# Patient Record
Sex: Male | Born: 1945 | Race: White | Hispanic: No | Marital: Married | Smoking: Current every day smoker
Health system: Southern US, Community
[De-identification: ages and names within clinical notes are randomized; demographics above are authoritative.]

## PROBLEM LIST (undated history)

## (undated) DIAGNOSIS — E119 Type 2 diabetes mellitus without complications: Secondary | ICD-10-CM

## (undated) DIAGNOSIS — J449 Chronic obstructive pulmonary disease, unspecified: Secondary | ICD-10-CM

## (undated) DIAGNOSIS — M4800 Spinal stenosis, site unspecified: Secondary | ICD-10-CM

## (undated) DIAGNOSIS — I10 Essential (primary) hypertension: Secondary | ICD-10-CM

## (undated) DIAGNOSIS — M5136 Other intervertebral disc degeneration, lumbar region: Secondary | ICD-10-CM

## (undated) DIAGNOSIS — M5416 Radiculopathy, lumbar region: Secondary | ICD-10-CM

## (undated) DIAGNOSIS — E7800 Pure hypercholesterolemia, unspecified: Secondary | ICD-10-CM

## (undated) DIAGNOSIS — G5600 Carpal tunnel syndrome, unspecified upper limb: Secondary | ICD-10-CM

## (undated) DIAGNOSIS — E669 Obesity, unspecified: Secondary | ICD-10-CM

## (undated) DIAGNOSIS — M48062 Spinal stenosis, lumbar region with neurogenic claudication: Secondary | ICD-10-CM

## (undated) DIAGNOSIS — G629 Polyneuropathy, unspecified: Secondary | ICD-10-CM

## (undated) DIAGNOSIS — M5412 Radiculopathy, cervical region: Secondary | ICD-10-CM

## (undated) DIAGNOSIS — I6523 Occlusion and stenosis of bilateral carotid arteries: Secondary | ICD-10-CM

## (undated) DIAGNOSIS — M5030 Other cervical disc degeneration, unspecified cervical region: Secondary | ICD-10-CM

## (undated) DIAGNOSIS — I739 Peripheral vascular disease, unspecified: Secondary | ICD-10-CM

## (undated) DIAGNOSIS — F17200 Nicotine dependence, unspecified, uncomplicated: Secondary | ICD-10-CM

## (undated) DIAGNOSIS — D638 Anemia in other chronic diseases classified elsewhere: Secondary | ICD-10-CM

## (undated) HISTORY — PX: OTHER SURGICAL HISTORY: SHX169

---

## 2004-04-23 ENCOUNTER — Ambulatory Visit: Payer: Self-pay | Admitting: Unknown Physician Specialty

## 2004-05-10 ENCOUNTER — Ambulatory Visit: Payer: Self-pay | Admitting: Unknown Physician Specialty

## 2004-05-11 ENCOUNTER — Ambulatory Visit: Payer: Self-pay | Admitting: Unknown Physician Specialty

## 2005-07-20 ENCOUNTER — Ambulatory Visit: Payer: Self-pay | Admitting: General Surgery

## 2005-08-02 ENCOUNTER — Ambulatory Visit: Payer: Self-pay | Admitting: General Surgery

## 2007-01-31 ENCOUNTER — Ambulatory Visit: Payer: Self-pay | Admitting: Unknown Physician Specialty

## 2007-06-07 ENCOUNTER — Ambulatory Visit: Payer: Self-pay | Admitting: Neurosurgery

## 2008-09-05 ENCOUNTER — Encounter: Admission: RE | Admit: 2008-09-05 | Discharge: 2008-09-05 | Payer: Self-pay | Admitting: Neurosurgery

## 2009-03-20 ENCOUNTER — Encounter: Payer: Self-pay | Admitting: Urology

## 2009-03-24 ENCOUNTER — Encounter: Payer: Self-pay | Admitting: Urology

## 2009-04-24 ENCOUNTER — Encounter: Payer: Self-pay | Admitting: Urology

## 2009-11-19 ENCOUNTER — Ambulatory Visit: Payer: Self-pay | Admitting: Vascular Surgery

## 2010-12-02 ENCOUNTER — Ambulatory Visit: Payer: Self-pay

## 2013-09-10 ENCOUNTER — Ambulatory Visit: Payer: Self-pay | Admitting: Physical Medicine and Rehabilitation

## 2014-07-14 ENCOUNTER — Ambulatory Visit
Admission: RE | Admit: 2014-07-14 | Discharge: 2014-07-14 | Disposition: A | Discharge status: Home / Self Care | Payer: Medicare Other | Source: Ambulatory Visit | Attending: Physical Medicine and Rehabilitation | Admitting: Physical Medicine and Rehabilitation

## 2014-07-14 ENCOUNTER — Other Ambulatory Visit: Payer: Self-pay | Admitting: Physical Medicine and Rehabilitation

## 2014-07-14 DIAGNOSIS — M7989 Other specified soft tissue disorders: Secondary | ICD-10-CM

## 2014-07-14 DIAGNOSIS — M79604 Pain in right leg: Secondary | ICD-10-CM | POA: Insufficient documentation

## 2014-07-14 DIAGNOSIS — M79661 Pain in right lower leg: Secondary | ICD-10-CM

## 2014-07-24 ENCOUNTER — Other Ambulatory Visit: Payer: Self-pay | Admitting: Physical Medicine and Rehabilitation

## 2014-07-24 DIAGNOSIS — M25571 Pain in right ankle and joints of right foot: Secondary | ICD-10-CM

## 2014-07-25 ENCOUNTER — Ambulatory Visit
Admission: RE | Admit: 2014-07-25 | Discharge: 2014-07-25 | Disposition: A | Discharge status: Home / Self Care | Payer: Medicare Other | Source: Ambulatory Visit | Attending: Physical Medicine and Rehabilitation | Admitting: Physical Medicine and Rehabilitation

## 2014-07-25 DIAGNOSIS — M25571 Pain in right ankle and joints of right foot: Secondary | ICD-10-CM

## 2019-05-18 ENCOUNTER — Other Ambulatory Visit: Payer: Self-pay

## 2019-05-18 ENCOUNTER — Ambulatory Visit: Payer: Medicare Other | Attending: Internal Medicine

## 2019-05-18 DIAGNOSIS — Z23 Encounter for immunization: Secondary | ICD-10-CM

## 2019-05-18 NOTE — Progress Notes (Signed)
   Covid-19 Vaccination Clinic  Name:  Paul Figueroa    MRN: [id number] DOB: 1945/04/27  05/18/2019  [name] was observed post Covid-19 immunization for 15 minutes without incident. He was provided with Vaccine Information Sheet and instruction to access the V-Safe system.   [name] was instructed to call 911 with any severe reactions post vaccine: Marland Kitchen Difficulty breathing  . Swelling of face and throat  . A fast heartbeat  . A bad rash all over body  . Dizziness and weakness   Immunizations Administered    Name Date Dose VIS Date Route   Pfizer COVID-19 Vaccine 05/18/2019 11:20 AM 0.3 mL 03/20/2018 Intramuscular   Manufacturer: [employer], [employer]   Lot: [id number]   NDC: [address]-5

## 2019-06-11 ENCOUNTER — Ambulatory Visit: Payer: Medicare Other | Attending: Internal Medicine

## 2019-06-11 DIAGNOSIS — Z23 Encounter for immunization: Secondary | ICD-10-CM

## 2019-06-11 NOTE — Progress Notes (Signed)
   Covid-19 Vaccination Clinic  Name:  Paul Figueroa    MRN: [id number] DOB: 1945-12-14  06/11/2019  [name] was observed post Covid-19 immunization for 15 minutes without incident. He was provided with Vaccine Information Sheet and instruction to access the V-Safe system.   [name] was instructed to call 911 with any severe reactions post vaccine: Marland Kitchen Difficulty breathing  . Swelling of face and throat  . A fast heartbeat  . A bad rash all over body  . Dizziness and weakness   Immunizations Administered    Name Date Dose VIS Date Route   Pfizer COVID-19 Vaccine 06/11/2019  2:05 PM 0.3 mL 03/20/2018 Intramuscular   Manufacturer: [employer], [employer]   Lot: [id number]   NDC: [address]-6

## 2019-06-18 ENCOUNTER — Emergency Department: Payer: Medicare Other

## 2019-06-18 ENCOUNTER — Encounter: Payer: Self-pay | Admitting: *Deleted

## 2019-06-18 ENCOUNTER — Other Ambulatory Visit: Payer: Self-pay

## 2019-06-18 DIAGNOSIS — F17200 Nicotine dependence, unspecified, uncomplicated: Secondary | ICD-10-CM | POA: Insufficient documentation

## 2019-06-18 DIAGNOSIS — E869 Volume depletion, unspecified: Secondary | ICD-10-CM | POA: Insufficient documentation

## 2019-06-18 DIAGNOSIS — R799 Abnormal finding of blood chemistry, unspecified: Secondary | ICD-10-CM | POA: Insufficient documentation

## 2019-06-18 DIAGNOSIS — E119 Type 2 diabetes mellitus without complications: Secondary | ICD-10-CM | POA: Insufficient documentation

## 2019-06-18 DIAGNOSIS — Y92410 Unspecified street and highway as the place of occurrence of the external cause: Secondary | ICD-10-CM | POA: Insufficient documentation

## 2019-06-18 DIAGNOSIS — S8001XA Contusion of right knee, initial encounter: Secondary | ICD-10-CM | POA: Diagnosis not present

## 2019-06-18 DIAGNOSIS — Y9389 Activity, other specified: Secondary | ICD-10-CM | POA: Diagnosis not present

## 2019-06-18 DIAGNOSIS — S20219A Contusion of unspecified front wall of thorax, initial encounter: Secondary | ICD-10-CM | POA: Diagnosis not present

## 2019-06-18 DIAGNOSIS — Y998 Other external cause status: Secondary | ICD-10-CM | POA: Diagnosis not present

## 2019-06-18 DIAGNOSIS — Z7984 Long term (current) use of oral hypoglycemic drugs: Secondary | ICD-10-CM | POA: Diagnosis not present

## 2019-06-18 DIAGNOSIS — S299XXA Unspecified injury of thorax, initial encounter: Secondary | ICD-10-CM | POA: Diagnosis present

## 2019-06-18 LAB — CBC
HCT: 38.2 % — ABNORMAL LOW (ref 39.0–52.0)
Hemoglobin: 12.6 g/dL — ABNORMAL LOW (ref 13.0–17.0)
MCH: 28.3 pg (ref 26.0–34.0)
MCHC: 33 g/dL (ref 30.0–36.0)
MCV: 85.7 fL (ref 80.0–100.0)
Platelets: 349 10*3/uL (ref 150–400)
RBC: 4.46 MIL/uL (ref 4.22–5.81)
RDW: 13.5 % (ref 11.5–15.5)
WBC: 10.1 10*3/uL (ref 4.0–10.5)
nRBC: 0 % (ref 0.0–0.2)

## 2019-06-18 LAB — BASIC METABOLIC PANEL
Anion gap: 8 (ref 5–15)
BUN: 42 mg/dL — ABNORMAL HIGH (ref 8–23)
CO2: 22 mmol/L (ref 22–32)
Calcium: 8.3 mg/dL — ABNORMAL LOW (ref 8.9–10.3)
Chloride: 109 mmol/L (ref 98–111)
Creatinine, Ser: 1.57 mg/dL — ABNORMAL HIGH (ref 0.61–1.24)
GFR calc Af Amer: 49 mL/min — ABNORMAL LOW (ref >60)
GFR calc non Af Amer: 42 mL/min — ABNORMAL LOW (ref >60)
Glucose, Bld: 141 mg/dL — ABNORMAL HIGH (ref 70–99)
Potassium: 4.3 mmol/L (ref 3.5–5.1)
Sodium: 139 mmol/L (ref 135–145)

## 2019-06-18 LAB — TROPONIN I (HIGH SENSITIVITY): Troponin I (High Sensitivity): 12 ng/L (ref <18)

## 2019-06-18 MED ORDER — SODIUM CHLORIDE 0.9% FLUSH: 3.0000 mL | Freq: Once | INTRAVENOUS | Status: DC
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
## 2019-06-19 ENCOUNTER — Emergency Department: Payer: Medicare Other

## 2019-06-19 ENCOUNTER — Emergency Department
Admission: EM | Admit: 2019-06-19 | Discharge: 2019-06-19 | Disposition: A | Discharge status: Home / Self Care | Payer: Medicare Other | Attending: Emergency Medicine | Admitting: Emergency Medicine

## 2019-06-19 ENCOUNTER — Encounter: Payer: Self-pay | Admitting: Emergency Medicine

## 2019-06-19 DIAGNOSIS — S20219A Contusion of unspecified front wall of thorax, initial encounter: Secondary | ICD-10-CM

## 2019-06-19 DIAGNOSIS — R7989 Other specified abnormal findings of blood chemistry: Secondary | ICD-10-CM

## 2019-06-19 DIAGNOSIS — E869 Volume depletion, unspecified: Secondary | ICD-10-CM

## 2019-06-19 HISTORY — DX: Nicotine dependence, unspecified, uncomplicated: F17.200

## 2019-06-19 HISTORY — DX: Type 2 diabetes mellitus without complications: E11.9

## 2019-06-19 HISTORY — DX: Radiculopathy, cervical region: M54.12

## 2019-06-19 HISTORY — DX: Chronic obstructive pulmonary disease, unspecified: J44.9

## 2019-06-19 HISTORY — DX: Radiculopathy, lumbar region: M54.16

## 2019-06-19 HISTORY — DX: Spinal stenosis, lumbar region with neurogenic claudication: M48.062

## 2019-06-19 HISTORY — DX: Anemia in other chronic diseases classified elsewhere: D63.8

## 2019-06-19 HISTORY — DX: Essential (primary) hypertension: I10

## 2019-06-19 HISTORY — DX: Pure hypercholesterolemia, unspecified: E78.00

## 2019-06-19 HISTORY — DX: Spinal stenosis, site unspecified: M48.00

## 2019-06-19 HISTORY — DX: Carpal tunnel syndrome, unspecified upper limb: G56.00

## 2019-06-19 HISTORY — DX: Other cervical disc degeneration, unspecified cervical region: M50.30

## 2019-06-19 HISTORY — DX: Occlusion and stenosis of bilateral carotid arteries: I65.23

## 2019-06-19 HISTORY — DX: Peripheral vascular disease, unspecified: I73.9

## 2019-06-19 HISTORY — DX: Polyneuropathy, unspecified: G62.9

## 2019-06-19 HISTORY — DX: Other intervertebral disc degeneration, lumbar region: M51.36

## 2019-06-19 HISTORY — DX: Obesity, unspecified: E66.9

## 2019-06-19 LAB — TROPONIN I (HIGH SENSITIVITY): Troponin I (High Sensitivity): 12 ng/L (ref <18)
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
## 2019-06-19 NOTE — ED Notes (Signed)
Pt at CT at this time.
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
## 2019-11-26 ENCOUNTER — Ambulatory Visit
Admission: RE | Admit: 2019-11-26 | Discharge: 2019-11-26 | Disposition: A | Discharge status: Home / Self Care | Payer: Medicare Other | Source: Ambulatory Visit | Attending: Family Medicine | Admitting: Family Medicine

## 2019-11-26 ENCOUNTER — Other Ambulatory Visit: Payer: Self-pay | Admitting: Family Medicine

## 2019-11-26 ENCOUNTER — Other Ambulatory Visit: Payer: Self-pay

## 2019-11-26 ENCOUNTER — Other Ambulatory Visit (HOSPITAL_COMMUNITY): Payer: Self-pay | Admitting: Family Medicine

## 2019-11-26 DIAGNOSIS — L539 Erythematous condition, unspecified: Secondary | ICD-10-CM

## 2019-11-26 DIAGNOSIS — M79661 Pain in right lower leg: Secondary | ICD-10-CM

## 2019-11-26 DIAGNOSIS — M7989 Other specified soft tissue disorders: Secondary | ICD-10-CM

## 2019-12-17 ENCOUNTER — Other Ambulatory Visit: Payer: Self-pay | Admitting: Physician Assistant

## 2019-12-17 DIAGNOSIS — M79661 Pain in right lower leg: Secondary | ICD-10-CM

## 2019-12-17 DIAGNOSIS — I739 Peripheral vascular disease, unspecified: Secondary | ICD-10-CM

## 2019-12-23 ENCOUNTER — Ambulatory Visit: Payer: Medicare Other | Attending: Physician Assistant

## 2019-12-24 ENCOUNTER — Other Ambulatory Visit: Payer: Self-pay

## 2019-12-24 ENCOUNTER — Ambulatory Visit
Admission: RE | Admit: 2019-12-24 | Discharge: 2019-12-24 | Disposition: A | Discharge status: Home / Self Care | Payer: Medicare Other | Source: Ambulatory Visit | Attending: Physician Assistant | Admitting: Physician Assistant

## 2019-12-24 DIAGNOSIS — I739 Peripheral vascular disease, unspecified: Secondary | ICD-10-CM | POA: Diagnosis not present

## 2020-09-24 DEATH — deceased
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
## 2021-06-14 IMAGING — US US EXTREM LOW VENOUS*R*
1 series · 13 of 24 positions shown · non-contrast
Comparison: None.

CLINICAL DATA: Right leg pain



[Series 1: us extrem low venous*right* · 0.08mm/px · 13 of 35 slices shown]
[im 1/35]
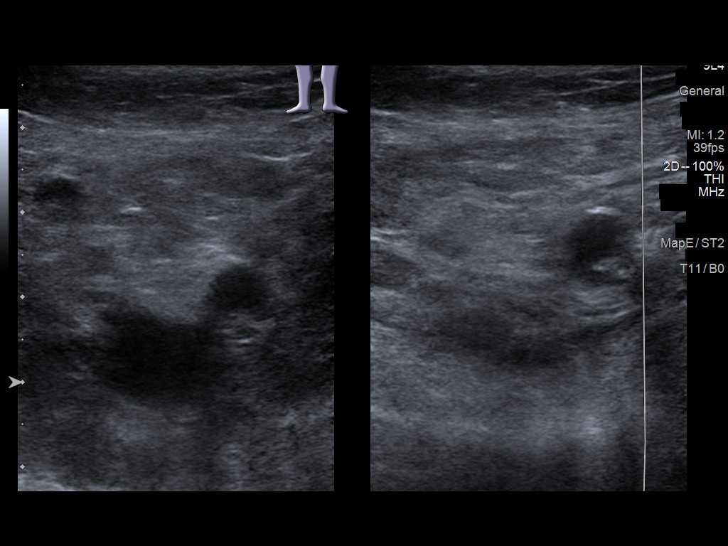
[im 3/35]
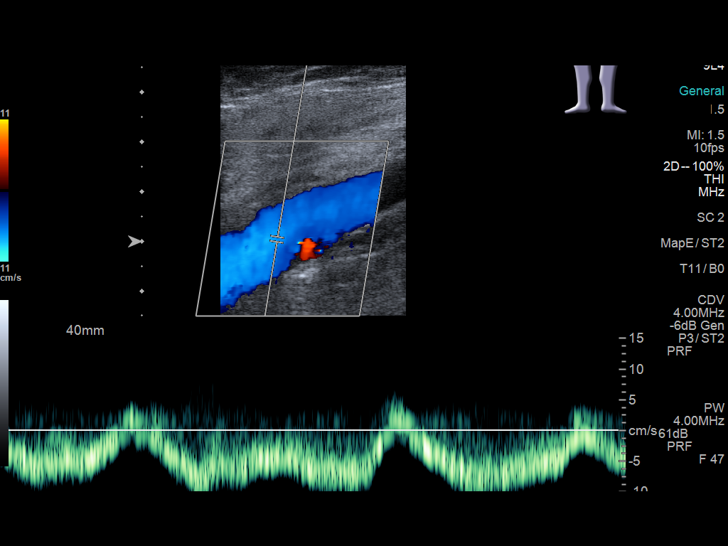
[im 6/35]
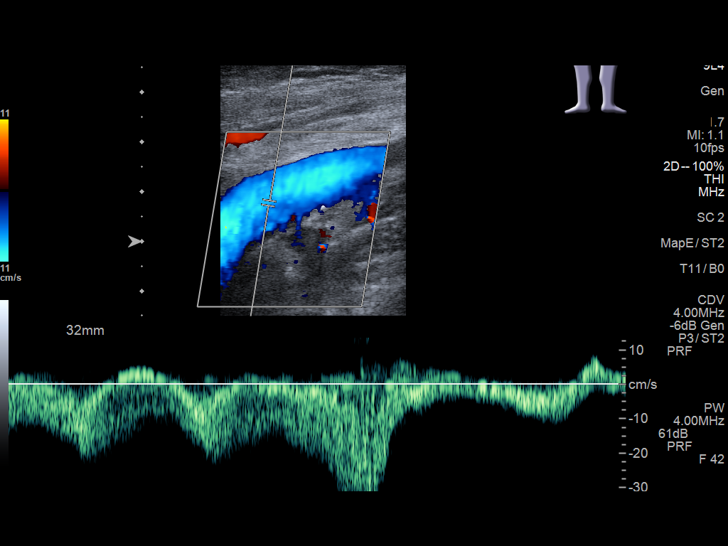
[im 9/35]
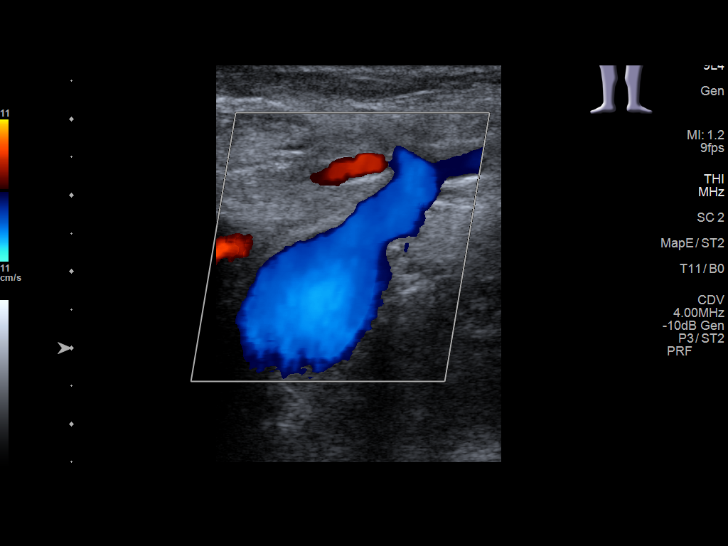
[im 12/35]
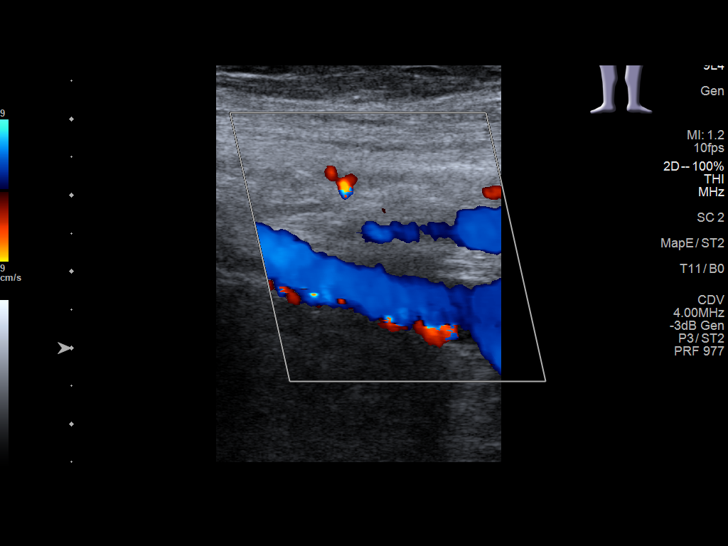
[im 15/35]
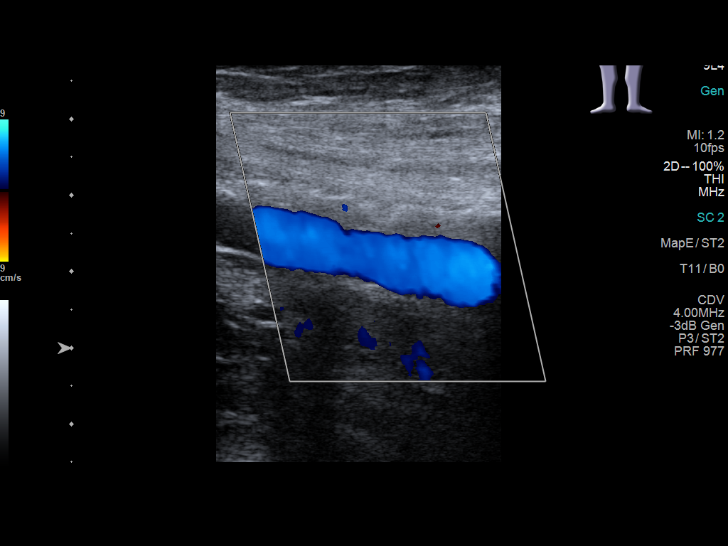
[im 18/35]
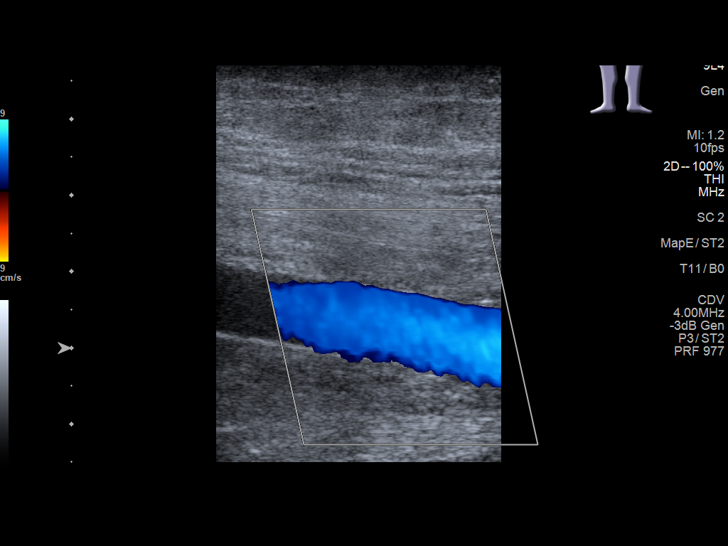
[im 20/35]
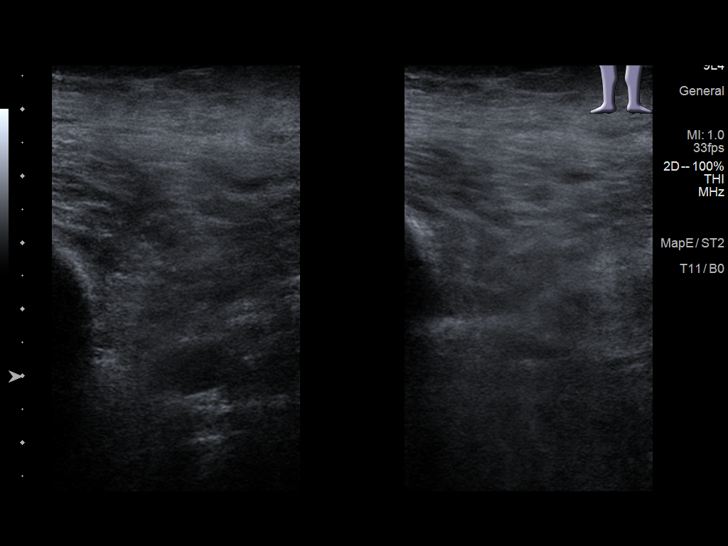
[im 23/35]
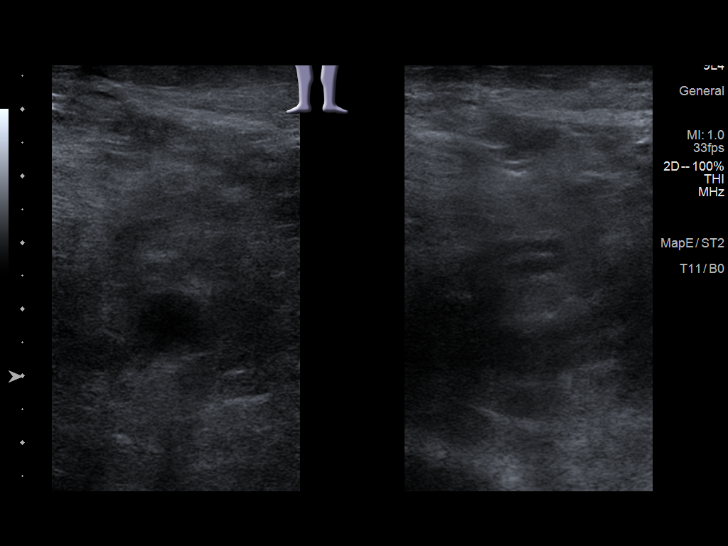
[im 26/35]
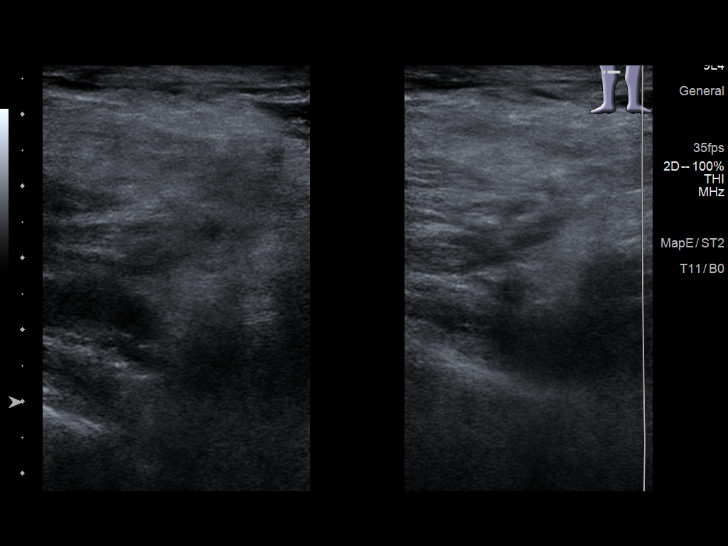
[im 29/35]
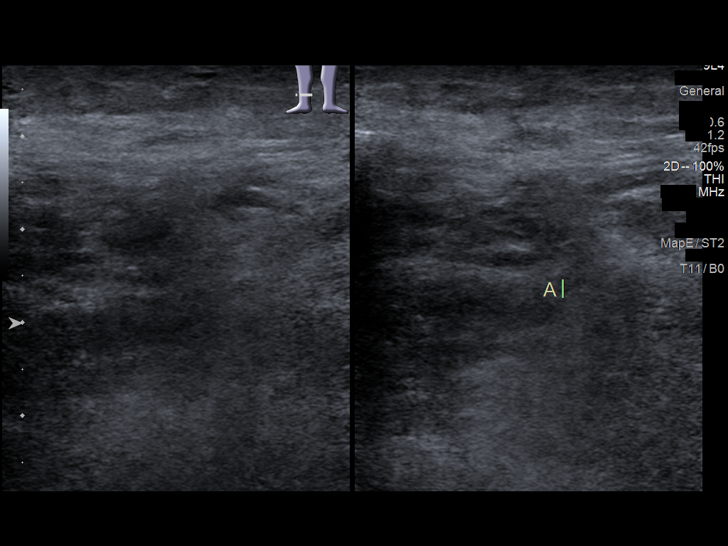
[im 32/35]
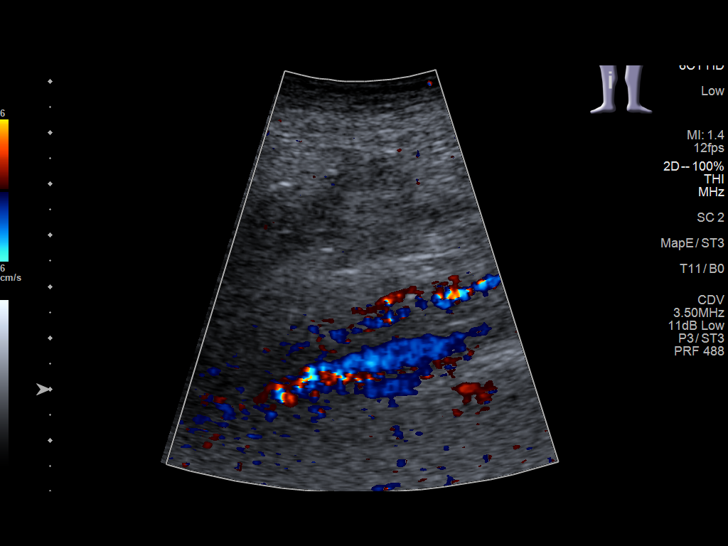
[im 35/35]
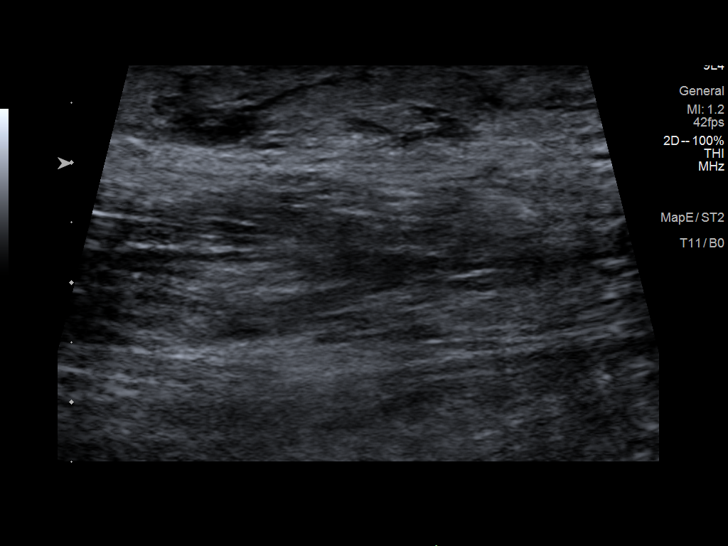

[13 of 24 positions shown; findings below may reference images not displayed]

FINDINGS: Contralateral Common Femoral Vein: Respiratory phasicity is normal
and symmetric with the symptomatic side. No evidence of thrombus.
Normal compressibility.

Common Femoral Vein: No evidence of thrombus. Normal
compressibility, respiratory phasicity and response to augmentation.

Saphenofemoral Junction: No evidence of thrombus. Normal
compressibility and flow on color Doppler imaging.

Profunda Femoral Vein: No evidence of thrombus. Normal
compressibility and flow on color Doppler imaging.

Femoral Vein: No evidence of thrombus. Normal compressibility,
respiratory phasicity and response to augmentation.

Popliteal Vein: No evidence of thrombus. Normal compressibility,
respiratory phasicity and response to augmentation.

Calf Veins: No evidence of thrombus. Normal compressibility and flow
on color Doppler imaging.

Superficial Great Saphenous Vein: No evidence of thrombus. Normal
compressibility.

Venous Reflux:  None.

Other Findings:  Calf edema is noted.
IMPRESSION: No evidence of deep venous thrombosis.
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
